# Patient Record
Sex: Male | Born: 1995
Health system: Southern US, Community
[De-identification: ages and names within clinical notes are randomized; demographics above are authoritative.]

---
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
## 2015-06-13 DIAGNOSIS — Z0000 Encounter for general adult medical examination without abnormal findings: Secondary | ICD-10-CM | POA: Diagnosis not present

## 2015-06-13 DIAGNOSIS — Z0100 Encounter for examination of eyes and vision without abnormal findings: Secondary | ICD-10-CM | POA: Diagnosis not present

## 2015-06-13 DIAGNOSIS — F909 Attention-deficit hyperactivity disorder, unspecified type: Secondary | ICD-10-CM | POA: Diagnosis not present

## 2015-06-13 DIAGNOSIS — Z0110 Encounter for examination of ears and hearing without abnormal findings: Secondary | ICD-10-CM | POA: Diagnosis not present

## 2015-06-13 MED FILL — predniSONE 20 MG TABS: 20 | 12 days supply | Qty: 20 | Fill #0

## 2015-06-13 MED FILL — HYDROCORTISONE 2.5% CREAM: 2.5 | 15 days supply | Qty: 30 | Fill #0

## 2015-10-28 DIAGNOSIS — F10129 Alcohol abuse with intoxication, unspecified: Secondary | ICD-10-CM | POA: Diagnosis not present

## 2015-10-28 DIAGNOSIS — R410 Disorientation, unspecified: Secondary | ICD-10-CM | POA: Diagnosis not present

## 2015-10-28 DIAGNOSIS — F909 Attention-deficit hyperactivity disorder, unspecified type: Secondary | ICD-10-CM | POA: Diagnosis not present

## 2015-10-28 DIAGNOSIS — R112 Nausea with vomiting, unspecified: Secondary | ICD-10-CM | POA: Diagnosis not present

## 2015-10-28 DIAGNOSIS — Z79899 Other long term (current) drug therapy: Secondary | ICD-10-CM | POA: Diagnosis not present

## 2015-10-28 DIAGNOSIS — F10120 Alcohol abuse with intoxication, uncomplicated: Secondary | ICD-10-CM | POA: Diagnosis not present

## 2015-11-09 ENCOUNTER — Ambulatory Visit (INDEPENDENT_AMBULATORY_CARE_PROVIDER_SITE_OTHER): Payer: 59 | Admitting: Sports Medicine

## 2015-11-09 ENCOUNTER — Ambulatory Visit
Admission: RE | Admit: 2015-11-09 | Discharge: 2015-11-09 | Disposition: A | Discharge status: Home / Self Care | Payer: 59 | Source: Ambulatory Visit | Attending: Sports Medicine | Admitting: Sports Medicine

## 2015-11-09 ENCOUNTER — Encounter: Payer: Self-pay | Admitting: Sports Medicine

## 2015-11-09 VITALS — BP 126/72 | Ht 74.0 in | Wt 182.0 lb

## 2015-11-09 DIAGNOSIS — M25531 Pain in right wrist: Secondary | ICD-10-CM

## 2015-11-09 DIAGNOSIS — S62231A Other displaced fracture of base of first metacarpal bone, right hand, initial encounter for closed fracture: Secondary | ICD-10-CM | POA: Diagnosis not present

## 2015-11-09 DIAGNOSIS — M79644 Pain in right finger(s): Secondary | ICD-10-CM | POA: Diagnosis not present

## 2015-11-09 DIAGNOSIS — S62233A Other displaced fracture of base of first metacarpal bone, unspecified hand, initial encounter for closed fracture: Secondary | ICD-10-CM | POA: Insufficient documentation

## 2015-11-09 NOTE — Progress Notes (Signed)
Patient ID: [name], male   DOB: 10-Sep-1995, 20 y.o.   MRN: [id number]  CC: R thumb pain  HPI: Nathan Carr Carr is an otherwise healthy 20 yo male who presents for evaluation of R thumb pain.  Pt states two days ago he was on top of the bed of a tractor-trailer when he slipped off (about 1 foot above ground) from standing and landed on both outstretched hands.  He heard a popping noise but cannot say whether it was his thumb or just the noise of his hand hitting the ground.  Soon after he noted mild pain in the R thumb where "the thumb attaches to the hand" that has continued through to today.  It became swollen that night and was painful when trying to raise thumb (makes motion of thumb sticking straight up/bending back towards wrist).  Pain is mild, aching at rest but sharp when tries to bend backwards.  Thumb makes new "crunching" sounds when he moves it.  Has tried only home ibuprofen with mild relief.  Swelling has gone down today compared to yesterday.  Has hx of breaking middle, ring, and pinky on that hand in elementary school and breaking a bone in the forearm on that side in middle school, currently without pain from those injuries.    ROS:  Gen: - fevers/chills, sleep disturbance MSK: per HPI Neuro: - weakness, pain/tingling/numbness shooting up R arm  PE: BP 126/72 mmHg  Ht 6\' 2"  (1.88 m)  Wt 182 lb (82.555 kg)  BMI 23.36 kg/m2  Gen: 20 yo caucasian male sitting on exam table in NAD HEENT: normocephalic, atraumatic Pulm: normal work of breathing MSK: RUE -- Dorsum of hand near thumb base is edematous compared to other hand, skin without lesions or discoloration.  Biceps, triceps, wrist flexion, wrist extension, grip strength 5/5 without pain.  Light touch sensation intact.  Full passive and active range of motion without pain. Resisted thumb extension reproduces aching pain, crepitation felt over carpometacarpal joint with any motion or thumb.  Point [facility]  -- Inspection of joints and skin, strength testing, sensation, passive and active ROM all within normal limits and nonpainful  A/P: Nathan Carr is a healthy 20 yo male presenting with new fracture of 1st metacarpal bone.  Fracture of 1st metacarpal bone Diagnoses based on X-rays taken just before visit showing triangular piece of base of 1st metacarpal bone malaligned to rest of bone, supported by point tenderness and crepitation on exam -- Pt advised to continue symptomatic treatment with OTC pain medication and avoiding activity with the thumb -- Provided with soft splint that restricts thumb motion proximal to PIP joint -- Will need to wear splint for 4-6 weeks -- Have his orthopedist view X-ray to see if agrees with recommendation  Follow-up in 2 weeks to re-evaluate joint swelling/pain and possible US of involved area  Nathan Carr, MS4  I examined and evaluated with MS4 and agree with findings and Tx approach;  Thumb spika splint

## 2015-11-23 ENCOUNTER — Ambulatory Visit (INDEPENDENT_AMBULATORY_CARE_PROVIDER_SITE_OTHER): Payer: 59 | Admitting: Sports Medicine

## 2015-11-23 ENCOUNTER — Encounter: Payer: Self-pay | Admitting: Sports Medicine

## 2015-11-23 VITALS — BP 140/65 | HR 72 | Ht 74.0 in | Wt 182.0 lb

## 2015-11-23 DIAGNOSIS — S62231D Other displaced fracture of base of first metacarpal bone, right hand, subsequent encounter for fracture with routine healing: Secondary | ICD-10-CM

## 2015-11-23 NOTE — Progress Notes (Signed)
Patient ID: Nathan Carr, male   DOB: 07-17-1995, 20 y.o.   MRN: [id number]  CC: f/u R 1st metacarpal base fracture  HPI: Nathan Carr is a 20 y.o. yo male with non-contributory PMH presenting for f/u R 5th metacarpal base fracture sustained two weeks ago.  PT has been wearing wrist splint since coming to office 6/1, states his pain is better.  Has kept splint on, only complaint is that it's hot.  Wondering how long he needs to wear it.  Has not required OTC pain medication or icing.    ROS: Gen: - fevers/chills MSK: - except as per HPI Neuro: no change in gait  PE: BP 140/65 mmHg  Pulse 72  Ht 6\' 2"  (1.88 m)  Wt 182 lb (82.555 kg)  BMI 23.36 kg/m2 Gen: 20 y.o. yo male sitting one exam table in NAD HEENT: normocephalic, atraumatic Pulm: normal work of breathing MSK:  RUE: inspection reveals no swelling or overlying skin changes, full active ROM, no TTP, mild crepitation felt over MTP with movement, sensation fully intact Gait: normal gait  Imaging:  MSK-US performed: 1st metacarpal and MTP visualized.  1st metacarpal base with small fragment of displaced bone noted medially, matrix of hyperechoic signal seen between fragment and rest of bone.  1st MTP joint visualized and normal in appearance There does appear to be soft and hard callus over Fx site This appears healing well with mod inc doppler flow  A/P: Nathan Carr is a 20 y.o. yo male presenting with well-healing R 1st metacarpal base fracture.  R 1st metacarpal fracture Healing nicely, evidence of callus formation on US  -- Advised to wear wrist splint for one week -- Second week can start taking splint off only to do squeezing exercises with hand, small foam ball  -- See back in 2 weeks to re-US and see if can go without splint, based on evidence today this is likely  Follow-up: 2 weeks  Francie Massingominick Joplin Canty, MS4/ agree with assessment and plan -- Sterling BigKB Fields, MD

## 2015-11-23 NOTE — Assessment & Plan Note (Signed)
Normal healing process Cont splint for 2 more wks

## 2015-12-07 ENCOUNTER — Ambulatory Visit (INDEPENDENT_AMBULATORY_CARE_PROVIDER_SITE_OTHER): Payer: 59 | Admitting: Sports Medicine

## 2015-12-07 ENCOUNTER — Encounter: Payer: Self-pay | Admitting: Sports Medicine

## 2015-12-07 VITALS — BP 131/66 | Ht 74.0 in | Wt 182.0 lb

## 2015-12-07 DIAGNOSIS — S62231D Other displaced fracture of base of first metacarpal bone, right hand, subsequent encounter for fracture with routine healing: Secondary | ICD-10-CM | POA: Diagnosis not present

## 2015-12-07 NOTE — Progress Notes (Signed)
Patient ID: Nathan Carr, male   DOB: [birth year], 20 y.o.   MRN: [id number]  followup metacarpal fracture  Patient returns for followup of first metacarpal fracture right hand Now 4 weeks By 2 weeks he had very little pain Crepitation has gone away in the last week He has been consistent using his thumb spica splint  Review of systems No other joint related pain No numbness tingling or weakness in the hand  Physical exam Athletic male in no acute distress BP 131/66 mmHg  Ht 6\' 2"  (1.88 m)  Wt 182 lb (82.555 kg)  BMI 23.36 kg/m2  Right hand shows full range of motion Normal alignment and rotation of the No tenderness to palpation or percussion of the first metacarpal  Ultrasound of the metacarpal shows that he has a healed area where he had a fracture line and callus before

## 2015-12-07 NOTE — Assessment & Plan Note (Signed)
This has healed completely [facility] clinical exam  Ultrasound suggests that he has good bony union of the fracture with only a slight irregularity remaining  He should start some hand exercises He can stop using the splint If pain returns he will recheck with us

## 2016-06-13 DIAGNOSIS — R040 Epistaxis: Secondary | ICD-10-CM | POA: Diagnosis not present

## 2016-10-29 ENCOUNTER — Ambulatory Visit (INDEPENDENT_AMBULATORY_CARE_PROVIDER_SITE_OTHER): Payer: 59 | Admitting: Sports Medicine

## 2016-10-29 ENCOUNTER — Encounter: Payer: Self-pay | Admitting: Sports Medicine

## 2016-10-29 DIAGNOSIS — G5701 Lesion of sciatic nerve, right lower limb: Secondary | ICD-10-CM

## 2016-10-29 DIAGNOSIS — G5700 Lesion of sciatic nerve, unspecified lower limb: Secondary | ICD-10-CM | POA: Insufficient documentation

## 2016-10-29 MED ORDER — CYCLOBENZAPRINE HCL 10 MG PO TABS
10.0000 mg | ORAL_TABLET | Freq: Every evening | ORAL | 0 refills | Status: DC | PRN
Start: 2016-10-29 — End: 2022-01-22

## 2016-10-29 MED FILL — CYCLOBENZAPRINE 10 MG TAB: 10 | 30 days supply | Qty: 30 | Fill #0

## 2016-10-29 NOTE — Progress Notes (Signed)
Subjective:     Patient ID: Nathan Carr, male   DOB: December 23, 1995, 21 y.o.   MRN: [id number]  HPI Patient is a 21 year old male who has been experiencing R gluteal pain that radiates down his R leg x 1 month. He reports the pain started around exam time at the beginning of May when he was spending many hours sitting in hard chairs. The radiating pain is described as electric shock with some movements and dull burning with prolonged sitting. He thinks it has gradually gotten worse. It is tight in the AM and loose with walking. Worse with bending forward, however twisting movements, leaning back, and laying flat have been fine. He has been icing it nightly and using ibuprofen intermittently which he thinks has not had much effect. Patient reports some pain with cough and sneeze but it is nowhere near the pain with bending forward.  Review of Systems + cough, sneeze pain + radiating pain in R leg - bowel/bladder incontinence - gait disturbance - numbness     Objective:   Physical Exam  BP 119/62   Ht 6\' 3"  (1.905 m)   Wt 187 lb (84.8 kg)   BMI 23.37 kg/m  General: Muscular male NAD Extremities: MSK: Inspection reveals no swelling or erythema overlying right hip or leg. Palpation reveals no tenderness over SI joints. No SIJ hypo or hyper mobility. TTP over right piriformis. No tenderness over spinous processes or paraspinal muscles of lower back. ROM is full in bilateral hips with flexion, extension, and lower leg rotation. There is increased tightness hamstrings.  Strength is 5/5 in bilateral hip extension, flexion, and rotation without pain. Negative Faber bilaterally Positive straight leg raise, ipsilaterally at 45 degrees. Negative contralateral SLR. Normal strength with walking on toes, heels, and outside of foot. Neuro: Sensation intact over bilateral lower extremities. No focal deficits observed.    Assessment:     Patient is a 21 year old male with history of radiating leg pain,  TTP over piriformis, and +SLR that is most concerning for piriformis syndrome. Concern was for disc herniation although this is less likely as could SLR to 45 degrees, has no weakness in leg muscles, and only mild cough/sneeze pain. Imaging does not seem to be warranted at this time. If this is piriformis syndrome, there should be significant improvement with stretching and relaxation of piriformis muscle.    Plan:     Sciatica 2/2 piriformis syndrome: - Cyclobenzaprine 10 mg PO QHS for 2 weeks and then prn pain - Naproxen 500 mg PO BID for 2 weeks and then prn pain - Piriformis stretches and hip exercises given to patient.  - Return if no improvement in 2 weeks.    [facility] MS4  I observed and examined the patient with the resident and agree with assessment and plan.  Note reviewed and modified by me. Enid BaasKarl Fields

## 2016-10-29 NOTE — Assessment & Plan Note (Signed)
HEP Stretches  [facility]  If not resolving in 2 weeks reck

## 2017-08-15 IMAGING — CR DG WRIST COMPLETE 3+V*R*
4 series · 4 of 4 positions shown · non-contrast
Comparison: None.

CLINICAL DATA: Patient c/o right thumb pain that extends into
wrist; pain x 3 days; patient injured hand removing a tire.

EXAM:
RIGHT WRIST - COMPLETE 3+ VIEW

[x wrist pa right]
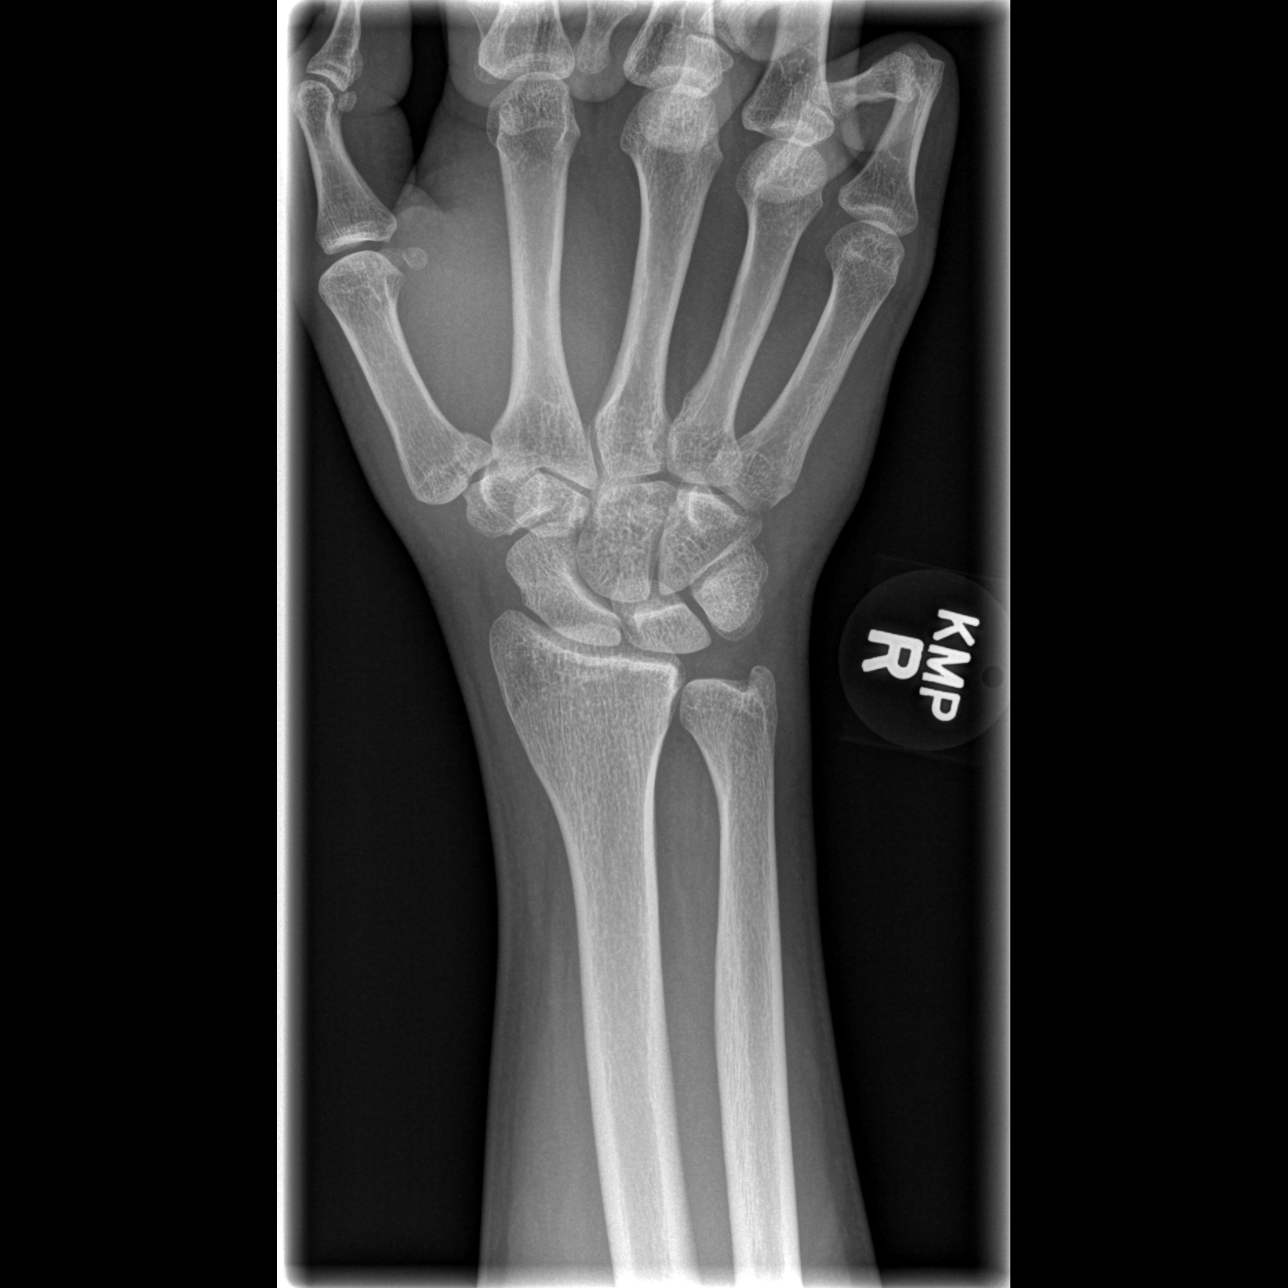

[x wrist obl right]
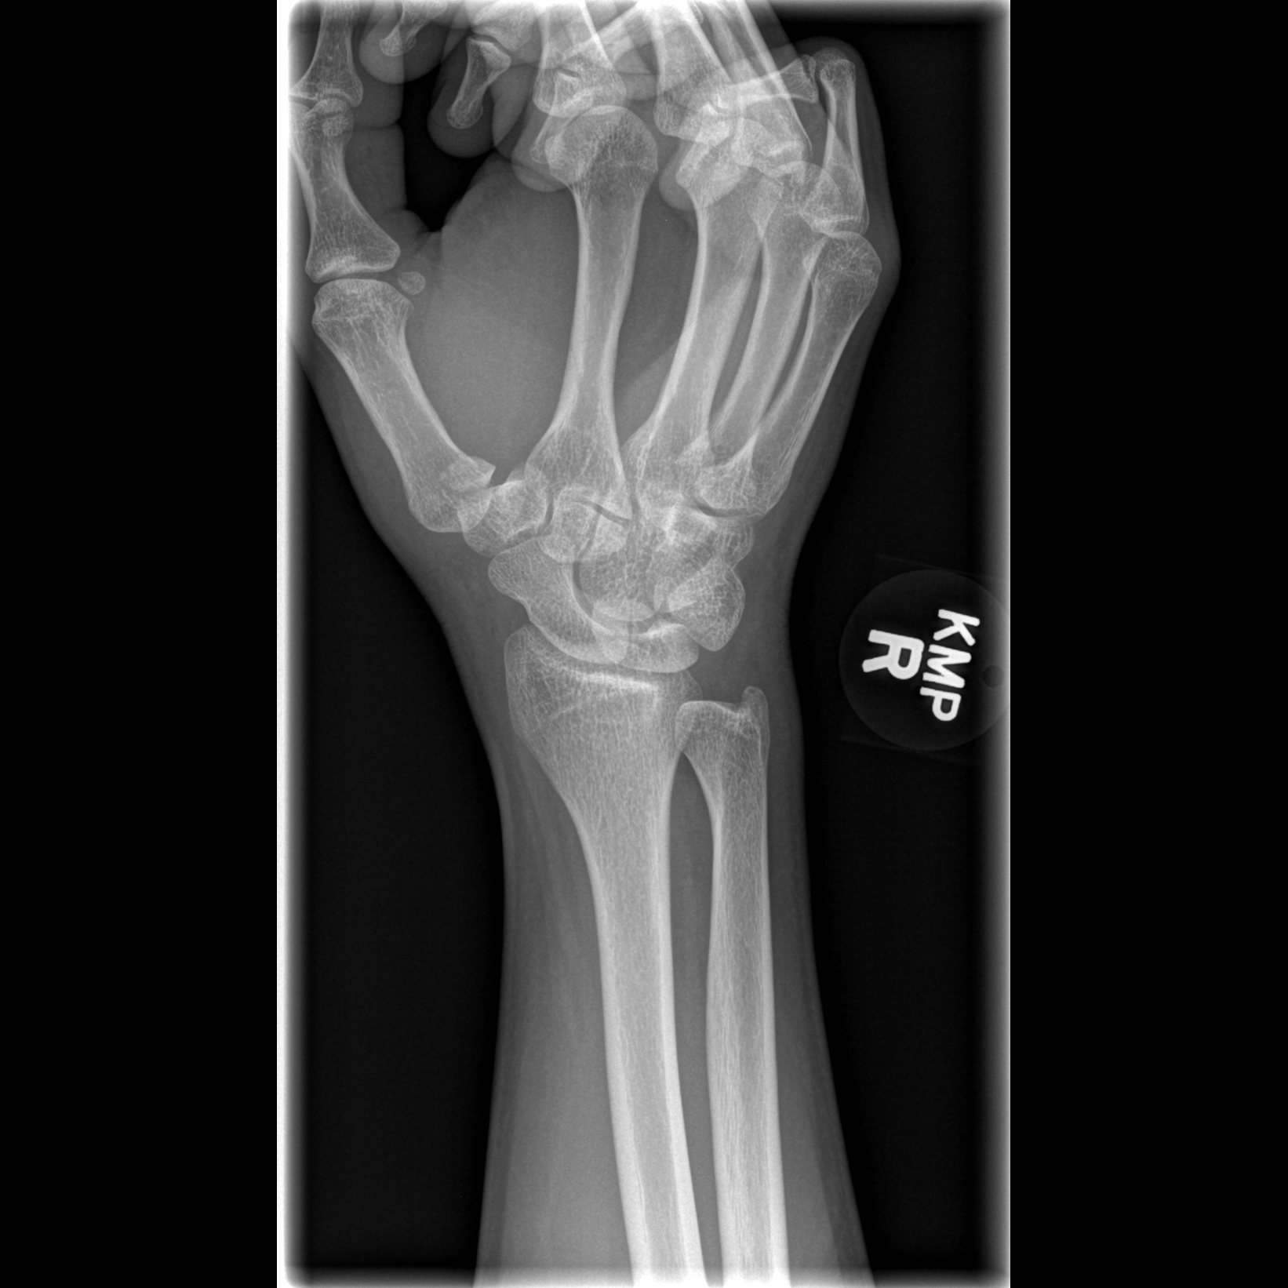

[x wrist lat right]
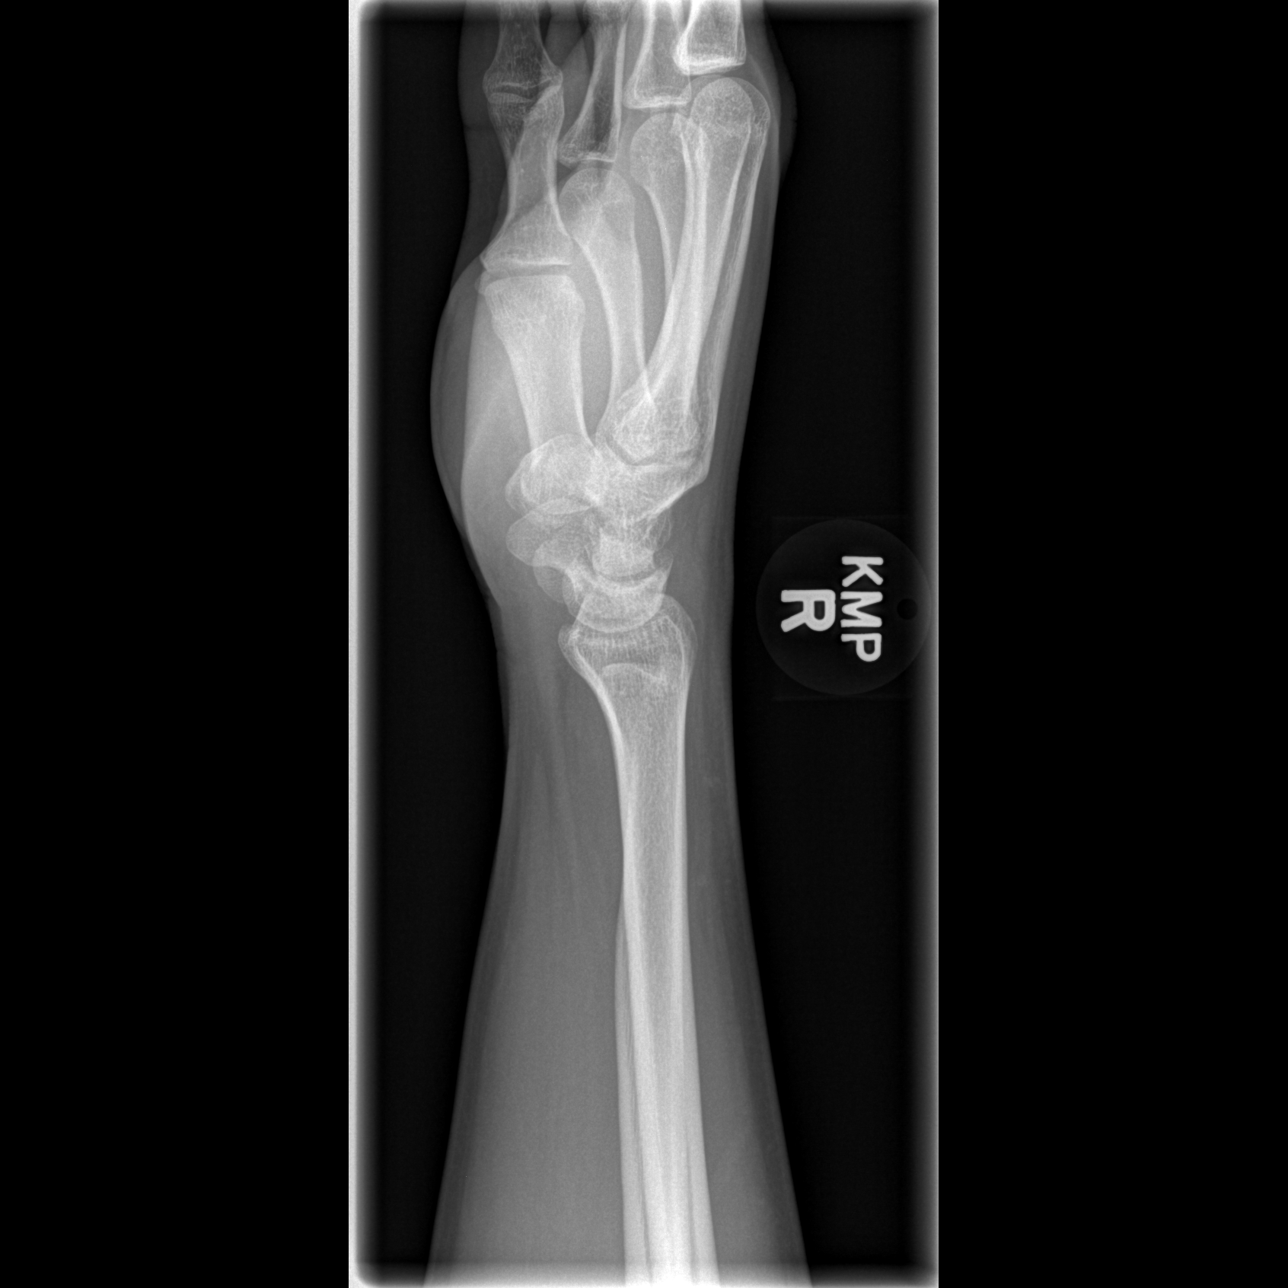

[x navicular]
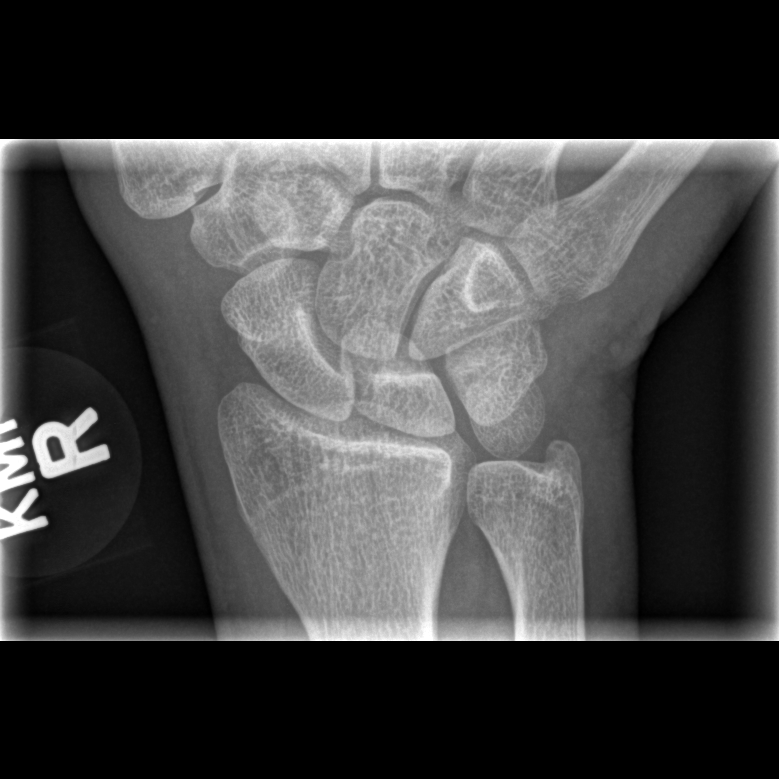

[4 of 4 positions shown; findings below may reference images not displayed]

FINDINGS: There is a fracture of the base of the first metacarpal described in
detail under the right thumb radiographs.

No other fractures. Right wrist joints are normally spaced and
aligned with no arthropathic change.

Soft tissues are unremarkable.
IMPRESSION: 1. Fracture at the base of the first metacarpal.
2. No other abnormality.

## 2017-09-21 DIAGNOSIS — H01001 Unspecified blepharitis right upper eyelid: Secondary | ICD-10-CM | POA: Diagnosis not present

## 2018-05-08 DIAGNOSIS — Z0000 Encounter for general adult medical examination without abnormal findings: Secondary | ICD-10-CM | POA: Diagnosis not present

## 2020-06-30 ENCOUNTER — Other Ambulatory Visit (HOSPITAL_BASED_OUTPATIENT_CLINIC_OR_DEPARTMENT_OTHER): Payer: Self-pay | Admitting: Family Medicine

## 2020-06-30 ENCOUNTER — Encounter: Payer: Self-pay | Admitting: Family Medicine

## 2020-06-30 ENCOUNTER — Ambulatory Visit (INDEPENDENT_AMBULATORY_CARE_PROVIDER_SITE_OTHER): Payer: Commercial Managed Care - PPO | Admitting: Family Medicine

## 2020-06-30 ENCOUNTER — Other Ambulatory Visit: Payer: Self-pay

## 2020-06-30 VITALS — BP 124/70 | Ht 74.0 in | Wt 205.0 lb

## 2020-06-30 DIAGNOSIS — M545 Low back pain, unspecified: Secondary | ICD-10-CM | POA: Diagnosis not present

## 2020-06-30 DIAGNOSIS — M5440 Lumbago with sciatica, unspecified side: Secondary | ICD-10-CM | POA: Insufficient documentation

## 2020-06-30 MED ORDER — KETOROLAC TROMETHAMINE 30 MG/ML IJ SOLN
30.0000 mg | Freq: Once | INTRAMUSCULAR | Status: AC
  Administered 2020-06-30 (×2): 30 mg via INTRA_ARTICULAR

## 2020-06-30 MED ORDER — PREDNISONE 5 MG PO TABS: ORAL_TABLET | ORAL | 0 refills | Status: DC

## 2020-06-30 MED ORDER — HYDROCODONE-ACETAMINOPHEN 5-325 MG PO TABS: 1.0000 | ORAL_TABLET | Freq: Three times a day (TID) | ORAL | 0 refills | Status: AC | PRN

## 2020-06-30 MED FILL — predniSONE 5 MG TABS: 5 | 6 days supply | Qty: 21 | Fill #0

## 2020-06-30 MED FILL — HYDROCODON-APAP 5-325: 5-325 | 5 days supply | Qty: 15 | Fill #0

## 2020-06-30 NOTE — Assessment & Plan Note (Signed)
Pain seems to be centered over the right SI joint.  Seems less likely for kidney stone.  Has had normal voids.  No history of surgery.  No radicular symptoms. -Counseled on home exercise therapy and supportive care. -IM Toradol. -Prednisone. -Norco as needed. -Could consider SI joint injection

## 2020-06-30 NOTE — Patient Instructions (Signed)
Nice to meet you Please try heat  Please try the exercises  Please use the norco for severe pain as needed.   Please send me a message in MyChart with any questions or updates.  Please see me back in 1-2 weeks.   --Dr. Jordan Likes

## 2020-06-30 NOTE — Progress Notes (Signed)
  Nathan Carr - 25 y.o. male MRN [id number]  Date of birth: 04-18-1996  SUBJECTIVE:  Including CC & ROS.  No chief complaint on file.   Nathan Carr is a 25 y.o. male that is presenting with acute right-sided low back pain.  This occurred this morning.  Denies any radicular symptoms.  No history of similar pain.  No history of surgery.  Has tried ibuprofen with little improvement.  No numbness or tingling.   Review of Systems See HPI   HISTORY: Past Medical, Surgical, Social, and Family History Reviewed & Updated per EMR.   Pertinent Historical Findings include:  History reviewed. No pertinent past medical history.  History reviewed. No pertinent surgical history.  History reviewed. No pertinent family history.  Social History   Socioeconomic History  . Marital status: Married    Spouse name: Not on file  . Number of children: Not on file  . Years of education: Not on file  . Highest education level: Not on file  Occupational History  . Not on file  Tobacco Use  . Smoking status: Never Smoker  . Smokeless tobacco: Never Used  Substance and Sexual Activity  . Alcohol use: Not on file  . Drug use: Not on file  . Sexual activity: Not on file  Other Topics Concern  . Not on file  Social History Narrative  . Not on file   Social Determinants of Health   Financial Resource Strain: Not on file  Food Insecurity: Not on file  Transportation Needs: Not on file  Physical Activity: Not on file  Stress: Not on file  Social Connections: Not on file  Intimate Partner Violence: Not on file     PHYSICAL EXAM:  VS: BP 124/70   Ht 6\' 2"  (1.88 m)   Wt 205 lb (93 kg)   BMI 26.32 kg/m  Physical Exam Gen: NAD, alert, cooperative with exam, well-appearing MSK:  Back: Tenderness palpation over the right SI joint. No swelling or ecchymosis. Normal hip range of motion. Negative straight leg raise. Neurovascular intact     ASSESSMENT & PLAN:   Acute right-sided low  back pain without sciatica Pain seems to be centered over the right SI joint.  Seems less likely for kidney stone.  Has had normal voids.  No history of surgery.  No radicular symptoms. -Counseled on home exercise therapy and supportive care. -IM Toradol. -Prednisone. -Norco as needed. -Could consider SI joint injection

## 2020-07-10 ENCOUNTER — Ambulatory Visit: Payer: Commercial Managed Care - PPO | Admitting: Family Medicine

## 2020-07-11 ENCOUNTER — Ambulatory Visit (INDEPENDENT_AMBULATORY_CARE_PROVIDER_SITE_OTHER): Payer: Commercial Managed Care - PPO | Admitting: Family Medicine

## 2020-07-11 ENCOUNTER — Other Ambulatory Visit: Payer: Self-pay

## 2020-07-11 DIAGNOSIS — M545 Low back pain, unspecified: Secondary | ICD-10-CM | POA: Diagnosis not present

## 2020-07-11 NOTE — Assessment & Plan Note (Signed)
Improvement of his symptoms though not completely resolved. -Counseled on home exercise therapy and supportive care. -Counseled on chiropractor. -Follow-up as needed.

## 2020-07-11 NOTE — Progress Notes (Signed)
  Nathan Carr - 25 y.o. male MRN [id number]  Date of birth: 07-Jul-1995  SUBJECTIVE:  Including CC & ROS.  No chief complaint on file.   Nathan Carr is a 25 y.o. male that is following up for his back pain.  His pain has improved significantly.  He still has pain after sitting for prolonged period of time or getting up in the morning.  Seems localized to lower back.   Review of Systems See HPI   HISTORY: Past Medical, Surgical, Social, and Family History Reviewed & Updated per EMR.   Pertinent Historical Findings include:  No past medical history on file.  No past surgical history on file.  No family history on file.  Social History   Socioeconomic History  . Marital status: Married    Spouse name: Not on file  . Number of children: Not on file  . Years of education: Not on file  . Highest education level: Not on file  Occupational History  . Not on file  Tobacco Use  . Smoking status: Never Smoker  . Smokeless tobacco: Never Used  Substance and Sexual Activity  . Alcohol use: Not on file  . Drug use: Not on file  . Sexual activity: Not on file  Other Topics Concern  . Not on file  Social History Narrative  . Not on file   Social Determinants of Health   Financial Resource Strain: Not on file  Food Insecurity: Not on file  Transportation Needs: Not on file  Physical Activity: Not on file  Stress: Not on file  Social Connections: Not on file  Intimate Partner Violence: Not on file     PHYSICAL EXAM:  VS: BP 136/86   Ht 6\' 2"  (1.88 m)   Wt 205 lb (93 kg)   BMI 26.32 kg/m  Physical Exam Gen: NAD, alert, cooperative with exam, well-appearing     ASSESSMENT & PLAN:   Acute right-sided low back pain without sciatica Improvement of his symptoms though not completely resolved. -Counseled on home exercise therapy and supportive care. -Counseled on chiropractor. -Follow-up as needed.

## 2020-11-21 ENCOUNTER — Encounter: Payer: Self-pay | Admitting: Sports Medicine

## 2020-11-21 ENCOUNTER — Ambulatory Visit (INDEPENDENT_AMBULATORY_CARE_PROVIDER_SITE_OTHER): Payer: Commercial Managed Care - PPO | Admitting: Sports Medicine

## 2020-11-21 ENCOUNTER — Other Ambulatory Visit: Payer: Self-pay

## 2020-11-21 ENCOUNTER — Other Ambulatory Visit (HOSPITAL_COMMUNITY): Payer: Self-pay

## 2020-11-21 VITALS — BP 116/82 | Ht 74.0 in | Wt 205.0 lb

## 2020-11-21 DIAGNOSIS — M5441 Lumbago with sciatica, right side: Secondary | ICD-10-CM | POA: Diagnosis not present

## 2020-11-21 DIAGNOSIS — M545 Low back pain, unspecified: Secondary | ICD-10-CM | POA: Diagnosis not present

## 2020-11-21 MED ORDER — GABAPENTIN 300 MG PO CAPS
300.0000 mg | ORAL_CAPSULE | Freq: Every day | ORAL | 0 refills | Status: DC
  Filled 2020-11-21: qty 30, 30d supply, fill #0

## 2020-11-21 NOTE — Progress Notes (Signed)
   PCP: Pcp, No  Subjective:   HPI: Patient is a 25 y.o. male here for follow-up on low back pain.  He has been following with Dr. Jordan Likes for this, last visit on 07/2020 he was doing well with home exercise program and supportive care.  At that visit, he reports he is doing fairly well until sometime in March when he started having a different type of pain shooting down the back of his leg.  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  The pain is mostly in his gluteal musculature and radiates down the back of his leg all the way to his feet and he has some numbness and tingling over his lateral toes at points.  Aggravated by forward flexion and sitting for a long time.  Relieved by standing and walking.  He has tried NSAIDs and has had a small amount of relief from this as well.  He does not seem to be worsening but is not improving.  No bowel or bladder incontinence, no saddle anesthesia, no significant weakness in his lower extremities he is aware of.   Review of Systems:  Per HPI.   PMFSH, medications and smoking status reviewed.      Objective:  Physical Exam: BP 116/82   Ht 6\' 2"  (1.88 m)   Wt 205 lb (93 kg)   BMI 26.32 kg/m   [facility] 11/21/2020  Frequency of aerobic exercise (# of days/week) 4  Average time in minutes 30  Frequency of strengthening activities (# of days/week) 0     Gen: awake, alert, NAD, comfortable in exam room Pulm: breathing unlabored  Lumbar spine:  Inspection: No evidence of erythema, ecchymosis, swelling edema.  Normal lordosis.  No signs of scoliosis with forward flexion. Palpation: No midline spinal tenderness. Nontender to facets. No paraspinal tenderness of the lumbar spines. Nontender to SI joints.  ROM: Intact to forward flexion, extension, rotation, and bending.  He has reproduction of right-sided leg symptoms with forward flexion. Strength: Full strength in bilateral  lower extremities Special tests: No pain with facet loading. Neg Stork testing.  Positive straight leg raise with reproduction of symptoms on the right down to the foot. Neurovascularly intact in bilateral lower extremities with normal patellar, Achilles, hamstrings reflexes bilaterally.   Assessment & Plan:  1.  Right-sided lumbar radiculopathy Differential for patient's right-sided radicular symptoms includes HNP, piriformis syndrome, spondylolisthesis/spondylolysis.  I suspect HNP given his chronic back pain and characterization of his pain going down the back of his leg along with history of heavy lifting.  Plan: -Two-view x-ray of lumbar spine to rule out spondy -Start gabapentin 300 mg nightly for 1 month -Flexion-based exercises for now, consider extension based if x-ray negative for spondy -Follow-up 1 month    11/23/2020, MD [facility] Fellow 11/21/2020 9:50 AM  I observed and examined the patient with the [facility] resident and agree with assessment and plan.  Note reviewed and modified by me.  If this worsens or any weakness then we will consider an MRI.  [facility], MD

## 2020-11-21 NOTE — Patient Instructions (Signed)
Thank you for coming in to see [place] today! Please see below to review our plan for today's visit:   1.  Please go to [facility] imaging to get x-rays of your low back. 2.  Please start gabapentin 300 mg at night for the next month 3.  Please start the flexion exercises that you were shown today.   Lets plan to follow-up in 1 month to reevaluate.   Please call the clinic at [phone] if your symptoms worsen or you have any concerns. It was our pleasure to serve you.       Dr. Guy Sandifer Dr. Roanna Epley [facility]

## 2020-11-21 NOTE — Assessment & Plan Note (Addendum)
This has been persistent Trial on Gabapentin XR Consider MRI unless responding quickly

## 2020-11-22 MED ORDER — GABAPENTIN 300 MG PO CAPS: 300.0000 mg | ORAL_CAPSULE | Freq: Every day | ORAL | 0 refills | Status: DC

## 2020-11-22 NOTE — Addendum Note (Signed)
Addended by: Annita Brod on: 11/22/2020 01:40 PM   Modules accepted: Orders

## 2020-11-24 ENCOUNTER — Other Ambulatory Visit: Payer: Self-pay

## 2020-11-24 ENCOUNTER — Ambulatory Visit (HOSPITAL_BASED_OUTPATIENT_CLINIC_OR_DEPARTMENT_OTHER)
Admission: RE | Admit: 2020-11-24 | Discharge: 2020-11-24 | Disposition: A | Discharge status: Home / Self Care | Payer: Commercial Managed Care - PPO | Source: Ambulatory Visit | Attending: Sports Medicine | Admitting: Sports Medicine

## 2020-11-24 DIAGNOSIS — M545 Low back pain, unspecified: Secondary | ICD-10-CM | POA: Diagnosis present

## 2020-12-21 ENCOUNTER — Ambulatory Visit: Payer: Commercial Managed Care - PPO | Admitting: Sports Medicine

## 2022-01-22 ENCOUNTER — Ambulatory Visit (INDEPENDENT_AMBULATORY_CARE_PROVIDER_SITE_OTHER): Payer: Commercial Managed Care - PPO | Admitting: Sports Medicine

## 2022-01-22 ENCOUNTER — Other Ambulatory Visit (HOSPITAL_COMMUNITY): Payer: Self-pay

## 2022-01-22 VITALS — BP 124/71 | Ht 75.0 in | Wt 210.0 lb

## 2022-01-22 DIAGNOSIS — M5441 Lumbago with sciatica, right side: Secondary | ICD-10-CM

## 2022-01-22 MED ORDER — MELOXICAM 15 MG PO TABS
15.0000 mg | ORAL_TABLET | Freq: Every day | ORAL | 2 refills | Status: AC
  Filled 2022-01-22: qty 30, 30d supply, fill #0
  Filled 2022-07-11: qty 30, 30d supply, fill #1

## 2022-01-22 MED ORDER — CYCLOBENZAPRINE HCL 10 MG PO TABS
10.0000 mg | ORAL_TABLET | Freq: Three times a day (TID) | ORAL | 2 refills | Status: AC | PRN
  Filled 2022-01-22: qty 30, 10d supply, fill #0
  Filled 2022-07-11: qty 30, 10d supply, fill #1

## 2022-01-22 NOTE — Progress Notes (Signed)
Chief complaint low back pain Patient has had a busy summer traveling to the beach most weekends.  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  He has a history that he had a severe bout of right-sided low back pain with sciatica in 2022.  Since that time he has had some minor flares but nothing serious.  2 days ago after returning from the beach his back became very painful particularly on the right side.  There was some radiation down into his buttocks and upper leg.  He did not have bowel or bladder difficulty.  He had no loss of sensation.  He had no weakness.  Coughing and sneezing are uncomfortable but not particularly painful. Ibuprofen has not provided much relief.  Physical exam Physically fit appearing white male in no acute distress but walking somewhat stiffly BP 124/71   Ht 6\' 3"  (1.905 m)   Wt 210 lb (95.3 kg)   BMI 26.25 kg/m  Range of motion of the back shows that he gets pain at 90 degrees of flexion.  Pain on lateral bend to the right.  Pain on right rotation. He can walk on his toes and on his heels without obvious weakness. Straight leg raise on the right side is positive Palpation reveals tenderness in the low lumbar area along the right side just above the SI joint  Review of lumbar spine films from 2021 did not show significant abnormalities

## 2022-01-22 NOTE — Assessment & Plan Note (Signed)
This is a second significantly limiting episode of low back pain and 1 of a number of minor flares of low back pain in a physically active young male. With his significant symptoms and his sciatica in the past and radicular findings now I believe we should get an MRI to see if he has disc disease or any abnormality on the right side that would be causing these recurrent flares  We will start him on meloxicam 15 mg at night Tylenol as needed for pain during the day Flexeril 10 mg to take at nighttime and during the day if he gets spasm  Recheck after completion of MRI

## 2022-01-23 ENCOUNTER — Ambulatory Visit
Admission: RE | Admit: 2022-01-23 | Discharge: 2022-01-23 | Disposition: A | Discharge status: Home / Self Care | Payer: Commercial Managed Care - PPO | Source: Ambulatory Visit | Attending: Sports Medicine | Admitting: Sports Medicine

## 2022-01-23 DIAGNOSIS — M5441 Lumbago with sciatica, right side: Secondary | ICD-10-CM

## 2022-01-24 ENCOUNTER — Other Ambulatory Visit (HOSPITAL_COMMUNITY): Payer: Self-pay

## 2022-01-24 ENCOUNTER — Other Ambulatory Visit: Payer: Self-pay | Admitting: Sports Medicine

## 2022-01-24 MED ORDER — PREDNISONE 5 MG PO TABS
ORAL_TABLET | ORAL | 0 refills | Status: AC
  Filled 2022-01-24: qty 21, 6d supply, fill #0

## 2022-01-24 MED ORDER — PREDNISONE 5 MG PO TABS: ORAL_TABLET | ORAL | 0 refills | Status: DC

## 2022-01-24 NOTE — Progress Notes (Signed)
With his acute disk sxs and MRI showing disc protrusion at L5/S1 I will order a week of prednisone 60 per day to see if we can settle this down.  KBF

## 2022-01-25 ENCOUNTER — Other Ambulatory Visit (HOSPITAL_COMMUNITY): Payer: Self-pay

## 2022-01-25 ENCOUNTER — Other Ambulatory Visit: Payer: Self-pay | Admitting: *Deleted

## 2022-01-27 ENCOUNTER — Other Ambulatory Visit: Payer: Commercial Managed Care - PPO

## 2022-02-07 ENCOUNTER — Ambulatory Visit (INDEPENDENT_AMBULATORY_CARE_PROVIDER_SITE_OTHER): Payer: Commercial Managed Care - PPO | Admitting: Sports Medicine

## 2022-02-07 DIAGNOSIS — M5441 Lumbago with sciatica, right side: Secondary | ICD-10-CM

## 2022-02-07 NOTE — Assessment & Plan Note (Signed)
MRI confirmed a small L5/s1 disc ruptrue  Did well with prednisone Start walking porgram Start HEP  Consider orthotics as with his flat feet and foot pain better comfort may lead to less back issues.

## 2022-02-07 NOTE — Patient Instructions (Signed)
Your spine has a disc protrusion at L5-S1 Lets get you on a preventative back program to keep this from flaring up again! Start with [place] Flexion exercises and daily walking for the next 4 weeks, then you can start extension exercises after that Make an appt to come back for custom orthotics to help prevent back pain in the long run It was nice meeting you today!  - Dr. Durene Cal Ferrell Flam

## 2022-02-07 NOTE — Progress Notes (Signed)
  Nathan Carr - 26 y.o. male MRN [id number]  Date of birth: Mar 07, 1996    CHIEF COMPLAINT: back pain      SUBJECTIVE:   Pleasant 26yo male here for follow up of low back pain. He had been dealing with lumbar back pain off and on for a few years now. He had an MRI earlier this month that showed right subarticular and foraminal disc protrusion at L5-S1 with impingement of the descending right S1 nerve root. He did a round of prednisone that really improved his symptoms. Today he has no midline back pain. No radicular symptoms. No bowel or bladder difficulty. No weakness.   ROS:     See HPI  PERTINENT  PMH / PSH FH / / SH:  Past Medical, Surgical, Social, and Family History Reviewed & Updated in the EMR.  Pertinent findings include:  none  OBJECTIVE: BP 137/78   Ht 6\' 3"  (1.905 m)   BMI 26.25 kg/m   Physical Exam:  Vital signs are reviewed.  GEN: Alert and oriented, NAD Pulm: Breathing unlabored PSY: normal mood, congruent affect  MSK: Lumbar spine - no deformity. Nontender to palpation along spinous processes, no bony step off. Non tender to palpation paraspinal musculature and SI joints. Full ROM in flexion, extension, side bending, and twisting.Symmetric 5/5 strength in hip flexion bilat.  Negative straight leg raise bilat. Negative stenchfield bilat. Neurovascularly intact distally.  ASSESSMENT & PLAN:  1. L5-S1 Disc Protrusion - patient improved significantly after prednisone taper. No symptoms today. Recommend core strengthening and flexion exercises along with a walking program to prevent back pain from re-occuring. He will come back to clinic to get prophylactic orthotics at his convenience. All questions answered and he agreed to plan  , MD PGY-4, Sports Medicine Fellow [facility]  I observed and examined the patient with the resident and agree with assessment and plan.  Note reviewed and modified by me. [facility], MD

## 2022-02-13 ENCOUNTER — Ambulatory Visit (INDEPENDENT_AMBULATORY_CARE_PROVIDER_SITE_OTHER): Payer: Commercial Managed Care - PPO | Admitting: Sports Medicine

## 2022-02-13 VITALS — Ht 75.0 in

## 2022-02-13 DIAGNOSIS — M2141 Flat foot [pes planus] (acquired), right foot: Secondary | ICD-10-CM | POA: Diagnosis not present

## 2022-02-13 DIAGNOSIS — M2142 Flat foot [pes planus] (acquired), left foot: Secondary | ICD-10-CM | POA: Diagnosis not present

## 2022-02-13 NOTE — Progress Notes (Signed)
   Established Patient Office Visit  Subjective   Patient ID: Nathan Carr, male    DOB: [birth year]  Age: 26 y.o. MRN: [id number]  Orthotics.  Patient presents today for custom orthotics.  He was recently been treated for acute right-sided low back pain with sciatica.  The symptoms have improved and at that visit it was recommended he return for prophylactic orthotics.  Patient reports growing up he did have bilateral knee discomfort.  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  He mainly walks in dress shoes while at work however switches between 2 separate pairs of [employer] while not working.  ROS as listed above in HPI    Objective:     Ht 6\' 3"  (1.905 m)   BMI 26.25 kg/m   Physical Exam Vitals reviewed.  Constitutional:      General: He is not in acute distress.    Appearance: Normal appearance. He is not ill-appearing, toxic-appearing or diaphoretic.  HENT:     Head: Normocephalic.  Pulmonary:     Effort: Pulmonary effort is normal.  Neurological:     Mental Status: He is alert.   Feet: No asymmetry.  No ecchymosis or edema.  Patient has obvious bilateral pes planus deformities.  Tenderness to palpation on the medial or lateral malleoli, or insertion of plantar fascia.  Full range of motion plantarflexion, dorsiflexion, supination and pronation.  Patient ambulates with bilateral feet pronated left greater than right.  Nonantalgic gait.    Assessment & Plan:   Problem List Items Addressed This Visit       Other   Pes planus - Primary    Patient was fitted for a : standard, cushioned, semi-rigid orthotic. The orthotic was heated and afterward the patient stood on the orthotic base positioned on the orthotic stand. The patient was positioned in subtalar neutral position and 10 degrees of ankle dorsiflexion in a weight bearing stance. After completion of molding, a stable base was applied to the orthotic blank. The base was ground to a stable  position for weight bearing. Size: 14 Base: Blue EVA Additional Posting and Padding: None The patient ambulated these, and they were very comfortable.        Return if symptoms worsen or fail to improve.    , DO  Addendum:  Patient seen in the office by fellow.  Her history, exam, plan of care were precepted with me.  Claudie Leach MD [facility]

## 2022-02-13 NOTE — Assessment & Plan Note (Addendum)
Patient was fitted for a : standard, cushioned, semi-rigid orthotic. The orthotic was heated and afterward the patient stood on the orthotic base positioned on the orthotic stand. The patient was positioned in subtalar neutral position and 10 degrees of ankle dorsiflexion in a weight bearing stance. After completion of molding, a stable base was applied to the orthotic blank. The base was ground to a stable position for weight bearing. Size: 14 Base: Blue EVA Additional Posting and Padding: None The patient ambulated these, and they were very comfortable.

## 2022-02-14 ENCOUNTER — Encounter: Payer: Self-pay | Admitting: Sports Medicine

## 2022-07-10 ENCOUNTER — Encounter: Payer: Self-pay | Admitting: Sports Medicine

## 2022-07-11 ENCOUNTER — Other Ambulatory Visit (HOSPITAL_COMMUNITY): Payer: Self-pay

## 2022-09-23 ENCOUNTER — Encounter: Payer: Self-pay | Admitting: *Deleted

## 2023-05-05 ENCOUNTER — Other Ambulatory Visit (HOSPITAL_COMMUNITY): Payer: Self-pay

## 2023-05-05 ENCOUNTER — Other Ambulatory Visit: Payer: Self-pay | Admitting: *Deleted

## 2023-05-05 MED ORDER — GABAPENTIN 300 MG PO CAPS
300.0000 mg | ORAL_CAPSULE | Freq: Every day | ORAL | 0 refills | Status: AC
  Filled 2023-05-05: qty 30, 30d supply, fill #0
